# Patient Record
Sex: Male | Born: 2000 | Race: Black or African American | Hispanic: No | Marital: Single | Smoking: Current every day smoker
Health system: Southern US, Community
[De-identification: ages and names within clinical notes are randomized; demographics above are authoritative.]

## PROBLEM LIST (undated history)

## (undated) DIAGNOSIS — Z789 Other specified health status: Secondary | ICD-10-CM

## (undated) DIAGNOSIS — F419 Anxiety disorder, unspecified: Secondary | ICD-10-CM

## (undated) HISTORY — PX: NO PAST SURGERIES: SHX2092

---
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
## 2020-02-06 IMAGING — CR LEFT TIBIA AND FIBULA - 2 VIEW
4 series · 4 of 4 positions shown · non-contrast
Comparison: None.

CLINICAL DATA: Left ankle pain after a tree fell on leg.

EXAM:
LEFT ANKLE COMPLETE - 3+ VIEW; LEFT TIBIA AND FIBULA - 2 VIEW

[tibia ap (1 of 2)]
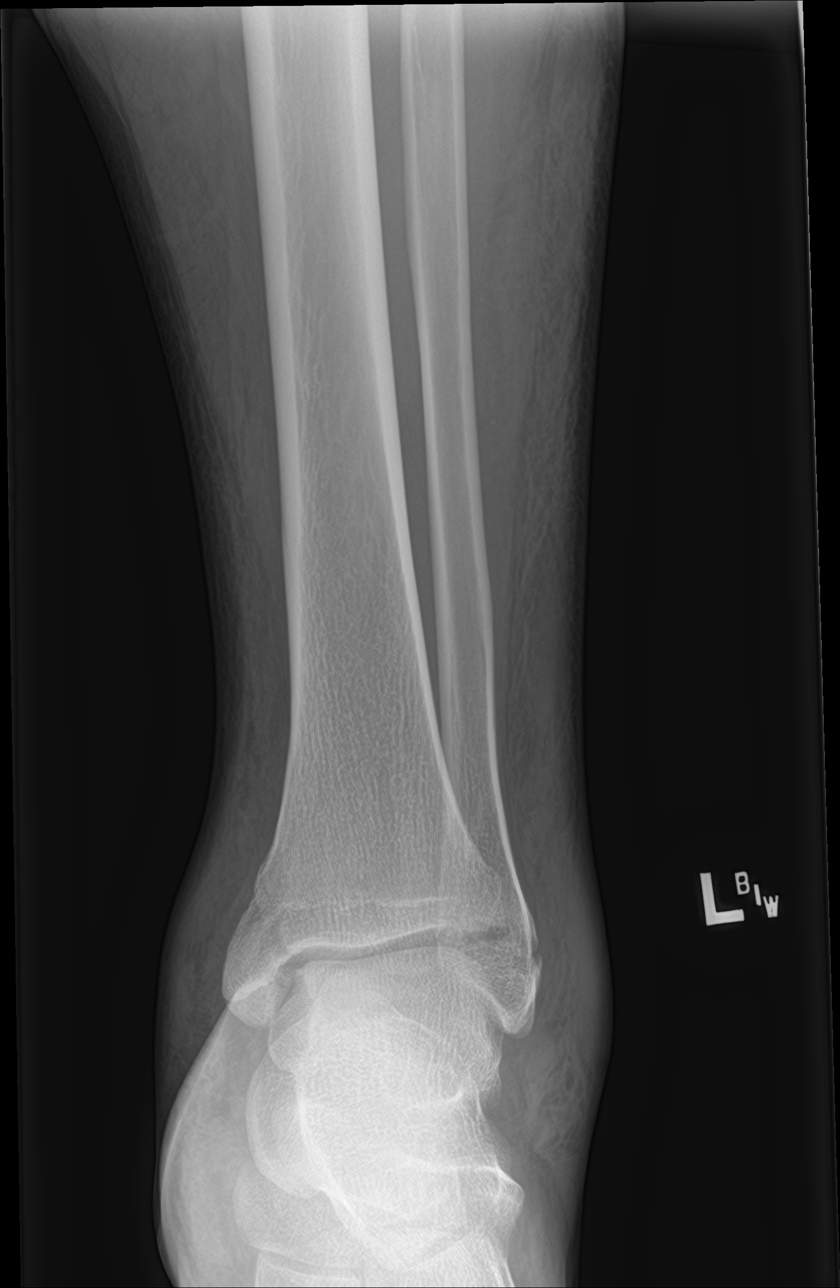

[tibia ap (2 of 2)]
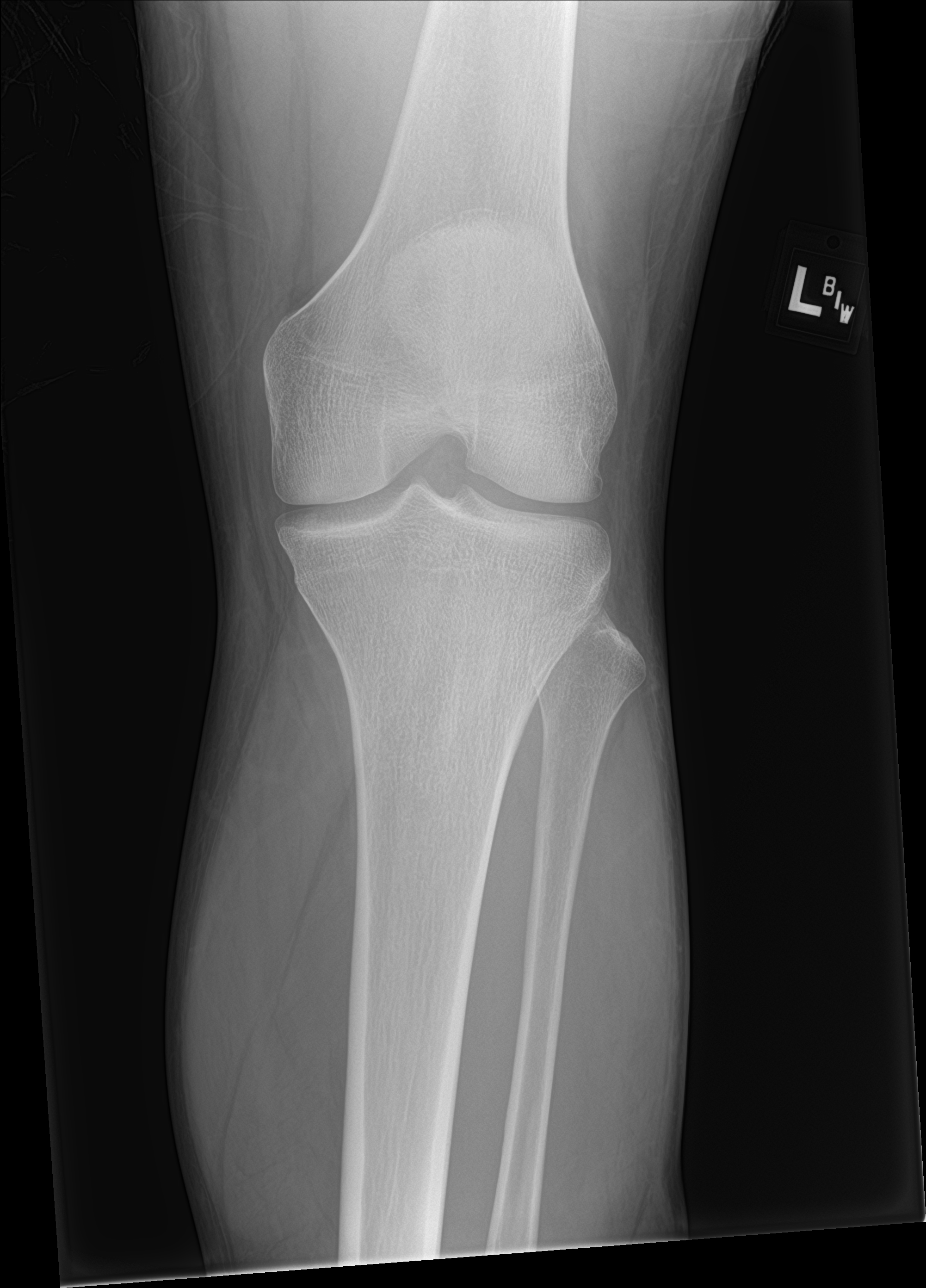

[tibia lat (1 of 2)]
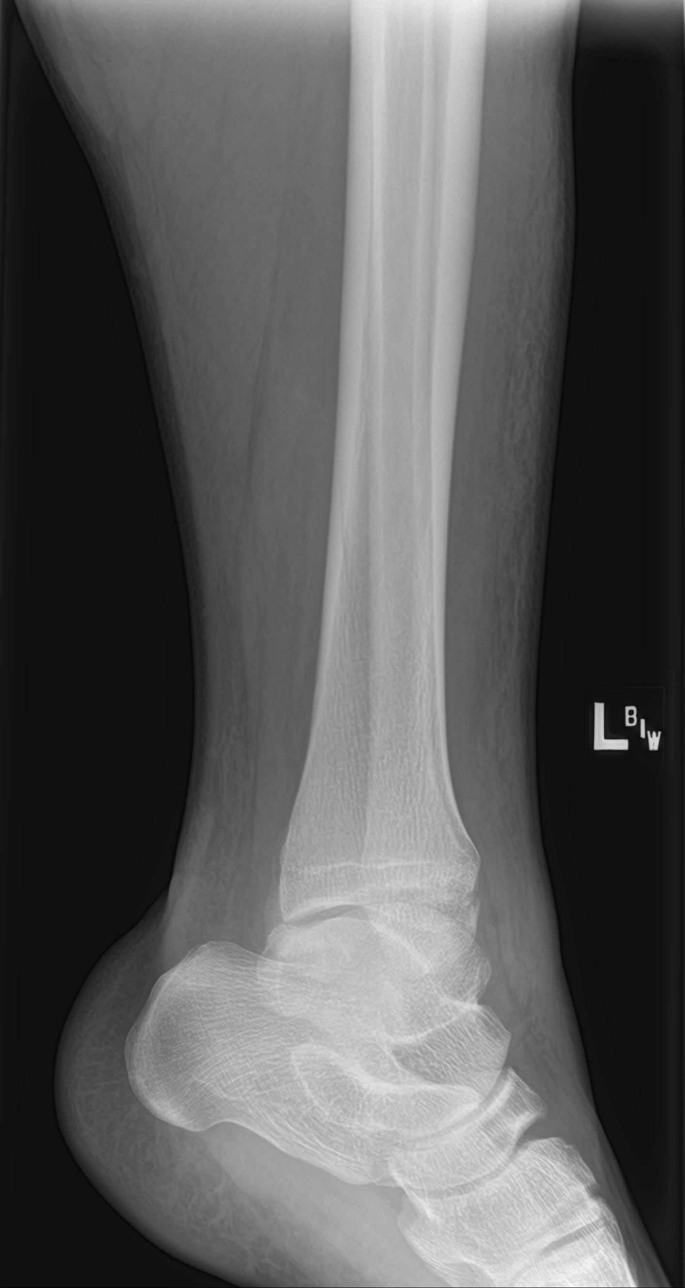

[tibia lat (2 of 2)]
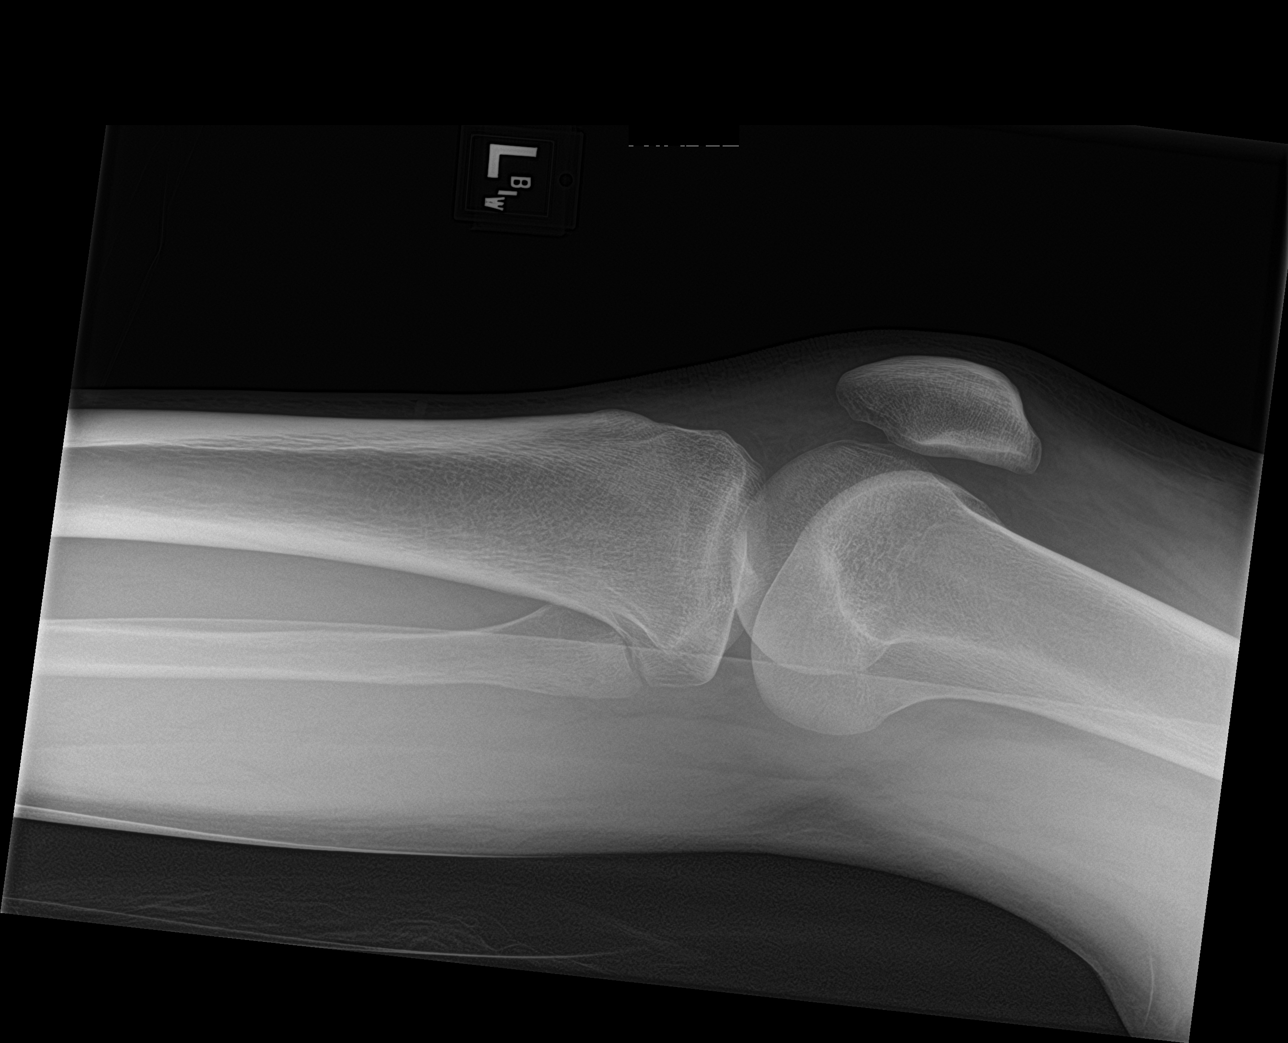

[4 of 4 positions shown; findings below may reference images not displayed]

FINDINGS: Acute nondisplaced intra-articular fractures of the medial and
lateral malleoli. The ankle mortise is symmetric. The talar dome is
intact. Small tibiotalar joint effusion. No proximal tibia or fibula
fracture. Joint spaces are preserved. Bone mineralization is normal.
Diffuse soft tissue swelling.
IMPRESSION: 1. Acute nondisplaced bimalleolar fractures with surrounding soft
tissue swelling.
2. No proximal tibia or fibula fracture.

## 2022-12-10 ENCOUNTER — Ambulatory Visit
Admission: EM | Admit: 2022-12-10 | Discharge: 2022-12-10 | Disposition: A | Discharge status: Home / Self Care | Payer: Medicaid Other | Attending: Internal Medicine | Admitting: Internal Medicine

## 2022-12-10 DIAGNOSIS — Z113 Encounter for screening for infections with a predominantly sexual mode of transmission: Secondary | ICD-10-CM | POA: Insufficient documentation

## 2022-12-10 DIAGNOSIS — R369 Urethral discharge, unspecified: Secondary | ICD-10-CM

## 2022-12-10 DIAGNOSIS — R300 Dysuria: Secondary | ICD-10-CM

## 2022-12-10 DIAGNOSIS — L739 Follicular disorder, unspecified: Secondary | ICD-10-CM | POA: Insufficient documentation

## 2022-12-10 DIAGNOSIS — Z202 Contact with and (suspected) exposure to infections with a predominantly sexual mode of transmission: Secondary | ICD-10-CM

## 2022-12-10 LAB — POCT URINALYSIS DIP (MANUAL ENTRY)
Bilirubin, UA: NEGATIVE
Blood, UA: NEGATIVE
Glucose, UA: NEGATIVE mg/dL
Ketones, POC UA: NEGATIVE mg/dL
Leukocytes, UA: NEGATIVE
Nitrite, UA: NEGATIVE
Protein Ur, POC: NEGATIVE mg/dL
Spec Grav, UA: 1.025 (ref 1.010–1.025)
Urobilinogen, UA: 1 E.U./dL
pH, UA: 6.5 (ref 5.0–8.0)

## 2022-12-10 MED ORDER — MUPIROCIN 2 % EX OINT: 1.0000 | TOPICAL_OINTMENT | Freq: Three times a day (TID) | CUTANEOUS | 0 refills | Status: AC

## 2022-12-10 MED ORDER — METRONIDAZOLE 500 MG PO TABS: 500.0000 mg | ORAL_TABLET | Freq: Two times a day (BID) | ORAL | 0 refills | Status: AC

## 2022-12-10 NOTE — ED Notes (Signed)
In with Ashlee Hermanns, PA for chaperone. 

## 2022-12-10 NOTE — ED Provider Notes (Signed)
[facility]  Note:  This document was prepared using Dragon voice recognition software and may include unintentional dictation errors.  MRN: [id number] DOB: July 06, 2000 DATE: 12/10/22   Subjective:  Chief Complaint:  Chief Complaint  Patient presents with   Dysuria     HPI: Jose Dougherty is a 22 y.o. male presenting for intermittent dysuria for the past 2 weeks. He reports burning with urination as well as possible penile discharge. Reports recently having unprotected sex with partner that tested positive for Trichomoniasis. He has had some intermittent testicular pain as well, but none currently. He has also noticed a few lesions in his groin area, but not on the genitals. Denies fever, nausea/vomiting, abdominal pain, back pain, hematuria. Endorses dysuria, penile discharge, testicular pain, skin lesions. Presents NAD.  Prior to Admission medications   Medication Sig Start Date End Date Taking? Authorizing Provider  docusate sodium (COLACE) 100 MG capsule Take 1 capsule (100 mg total) by mouth 2 (two) times daily. While taking narcotic pain medicine. 08/29/18   Jacinta Shoe, PA-C  naproxen (NAPROSYN) 500 MG tablet Take 1 tablet (500 mg total) by mouth 2 (two) times daily with a meal. 08/21/18   Caccavale, Sophia, PA-C  senna (SENOKOT) 8.6 MG TABS tablet Take 2 tablets (17.2 mg total) by mouth 2 (two) times daily. 08/29/18   Jacinta Shoe, PA-C     No Known Allergies  History:   Past Medical History:  Diagnosis Date   Anxiety    Medical history non-contributory      Past Surgical History:  Procedure Laterality Date   NO PAST SURGERIES     ORIF ANKLE FRACTURE Left 08/29/2018   Procedure: OPEN REDUCTION INTERNAL FIXATION (ORIF) LEFT ANKLE BIMALLEOLAR FRACTURE;  Surgeon: Toni Arthurs, MD;  Location: [facility];  Service: Orthopedics;  Laterality: Left;    History reviewed. No pertinent family history.  Social History   Tobacco Use   Smoking  status: Every Day    Current packs/day: 0.50    Types: Cigarettes   Smokeless tobacco: Never  Vaping Use   Vaping status: Never Used  Substance Use Topics   Alcohol use: Yes   Drug use: Yes    Types: Marijuana    Review of Systems  Constitutional:  Negative for fever.  Gastrointestinal:  Negative for abdominal pain, nausea and vomiting.  Genitourinary:  Positive for dysuria, penile discharge and testicular pain. Negative for flank pain, genital sores and hematuria.  Musculoskeletal:  Negative for back pain.  Skin:  Positive for rash.     Objective:   Vitals: BP 133/85 (BP Location: Right Arm)   Pulse (!) 52   Temp 98.1 F (36.7 C) (Oral)   Resp 18   SpO2 98%   Physical Exam Exam conducted with a chaperone present.  Constitutional:      General: He is not in acute distress.    Appearance: Normal appearance. He is well-developed. He is obese. He is not ill-appearing or toxic-appearing.  HENT:     Head: Normocephalic and atraumatic.  Cardiovascular:     Rate and Rhythm: Normal rate and regular rhythm.     Heart sounds: Normal heart sounds.  Pulmonary:     Effort: Pulmonary effort is normal.     Breath sounds: Normal breath sounds.     Comments: Clear to auscultation bilaterally  Abdominal:     General: Bowel sounds are normal.     Palpations: Abdomen is soft.     Tenderness: There  is no abdominal tenderness. There is no right CVA tenderness or left CVA tenderness.     Hernia: There is no hernia in the left inguinal area or right inguinal area.  Genitourinary:    Penis: No discharge or lesions.      Testes: Normal.     Comments: There are approximately 2-3 papular lesions in the patient's right groin and on his suprapubic area. No warmth, erythema, or discharge. Nontender to palpation. Musculoskeletal:     Lumbar back: Normal.  Skin:    General: Skin is warm and dry.     Findings: Rash present. Rash is papular.  Neurological:     General: No focal deficit  present.     Mental Status: He is alert.  Psychiatric:        Mood and Affect: Mood and affect normal.     Results:  Labs: Results for orders placed or performed during the hospital encounter of 12/10/22 (from the past 24 hour(s))  POCT urinalysis dipstick     Status: None   Collection Time: 12/10/22 10:39 AM  Result Value Ref Range   Color, UA yellow yellow   Clarity, UA clear clear   Glucose, UA negative negative mg/dL   Bilirubin, UA negative negative   Ketones, POC UA negative negative mg/dL   Spec Grav, UA 2.952 8.413 - 1.025   Blood, UA negative negative   pH, UA 6.5 5.0 - 8.0   Protein Ur, POC negative negative mg/dL   Urobilinogen, UA 1.0 0.2 or 1.0 E.U./dL   Nitrite, UA Negative Negative   Leukocytes, UA Negative Negative    Radiology: No results found.   UC Course/Treatments:  Procedures: Procedures   Medications Ordered in UC: Medications - No data to display   Assessment and Plan :     ICD-10-CM   1. Dysuria  R30.0     2. Penile discharge  R36.9     3. Exposure to trichomonas  Z20.2     4. Screening for STDs (sexually transmitted diseases)  Z11.3 HIV Antibody (routine testing w rflx)    HIV4GL Save Tube    RPR    HIV Antibody (routine testing w rflx)    HIV4GL Save Tube    RPR    5. Folliculitis  L73.9 Hsv Culture And Typing    Hsv Culture And Typing     Dysuria Afebrile, nontoxic-appearing, NAD. VSS. DDX includes but not limited to: STD, cystitis, pyelonephritis UA was unremarkable today in office.  Cytology is pending.  Suspect STD especially given exposure to trichomoniasis.  At this point will empirically treat for trichomoniasis with Flagyl 500 mg twice daily.  Safe sex precautions advised.  Will adjust treatment based on cytology results as needed. Strict ED precautions were given and patient verbalized understanding.  Penile discharge Afebrile, nontoxic-appearing, NAD. VSS. DDX includes but not limited to: Gonorrhea, chlamydia,  trichomoniasis Cytology is pending.  Suspect STD especially given exposure to trichomoniasis.  At this point will empirically treat for trichomoniasis with Flagyl 500 mg twice daily.  Safe sex precautions advised.  Will adjust treatment based on cytology results as needed. Strict ED precautions were given and patient verbalized understanding.  Exposure to trichomonas Afebrile, nontoxic-appearing, NAD. VSS. Cytology is pending.  Suspect STD especially given exposure to trichomoniasis and current symptoms.  At this point will empirically treat for trichomoniasis with Flagyl 500 mg twice daily.  Safe sex precautions advised.  Will adjust treatment based on cytology results as needed. Strict ED precautions  were given and patient verbalized understanding.  Screening for STDs Afebrile, nontoxic-appearing, NAD. VSS. Cytology is pending.  Suspect STD especially given exposure to trichomoniasis and current symptoms.  At this point will empirically treat for trichomoniasis with Flagyl 500 mg twice daily.  Safe sex precautions advised.  Will adjust treatment based on cytology results as needed.  HIV and RPR are pending as well. Strict ED precautions were given and patient verbalized understanding.  Folliculitis Afebrile, nontoxic-appearing, NAD. VSS. DDX includes but not limited to: Folliculitis, HSV, HPV, abscess Based on appearance, low suspicion for HSV at this time.  However HSV culture was obtained.  Will treat according to results if necessary.  Suspect folliculitis given appearance.  Mupirocin 1 application 3 times daily as needed was prescribed. Strict ED precautions were given and patient verbalized understanding.  ED Discharge Orders          Ordered    metroNIDAZOLE (FLAGYL) 500 MG tablet  Every 12 hours        12/10/22 1047    mupirocin ointment (BACTROBAN) 2 %  3 times daily        12/10/22 1047             PDMP not reviewed this encounter.     Kaydon Creedon P, PA-C 12/10/22  1058

## 2022-12-10 NOTE — ED Triage Notes (Signed)
  Pt c/o Slight penile pain x 2 weeks and pain with urination, some penile discharge the other day, sharp pain in testicle when pushing during urination requesting STD testing Denies: testicular, abd/back pain, F/Chills or body aches  Sexual partner advised him recently that she had Trichomonas.

## 2022-12-10 NOTE — Discharge Instructions (Addendum)
Your swabs and blood work were sent to the lab for further testing.  You will be called with results. Flagyl is an antibiotic given to treat trichomoniasis. Take the prescription as directed. I have also sent you a prescription for Bactroban as well. This is an antibiotic onitment used to treat infected hair follicles. Use this on the lesions in your groin region as directed. External use only.  You should avoid all sexual activity until you have been notified of all your results and have undergone any necessary treatment.  If you are positive, it is recommended that you inform all sexual partners so they can treat be treated as well before having sex again.

## 2022-12-11 LAB — CYTOLOGY, (ORAL, ANAL, URETHRAL) ANCILLARY ONLY
Chlamydia: NEGATIVE
Comment: NEGATIVE
Comment: NEGATIVE
Comment: NORMAL
Neisseria Gonorrhea: NEGATIVE
Trichomonas: NEGATIVE

## 2022-12-12 LAB — RPR: RPR Ser Ql: NONREACTIVE

## 2022-12-12 LAB — HIV ANTIBODY (ROUTINE TESTING W REFLEX): HIV Screen 4th Generation wRfx: NONREACTIVE

## 2022-12-14 LAB — HSV CULTURE AND TYPING
